# Patient Record
Sex: Male | Born: 1940 | Race: White | Hispanic: No | Marital: Married | Smoking: Current every day smoker
Health system: Southern US, Community
[De-identification: ages and names within clinical notes are randomized; demographics above are authoritative.]

## PROBLEM LIST (undated history)

## (undated) HISTORY — PX: APPENDECTOMY: SHX54

## (undated) HISTORY — PX: JOINT REPLACEMENT: SHX530

---

## 2009-10-22 ENCOUNTER — Encounter: Admission: RE | Admit: 2009-10-22 | Discharge: 2009-10-22 | Payer: Self-pay | Admitting: Endocrinology

## 2011-03-17 IMAGING — US US SOFT TISSUE HEAD/NECK
1 series · 13 of 25 positions shown · non-contrast
Comparison: None.

CLINICAL DATA: Lump left sided neck with hoarseness.

THYROID ULTRASOUND
TECHNIQUE: Ultrasound examination of the thyroid gland and adjacent
soft tissues was performed.

[Series 1: us soft tissue head/neck · 0.09mm/px · 13 of 44 slices shown]
[im 1/44]
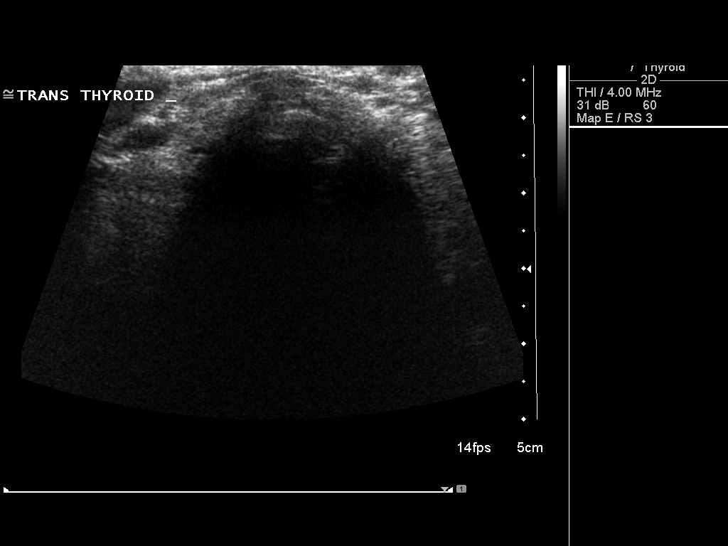
[im 4/44]
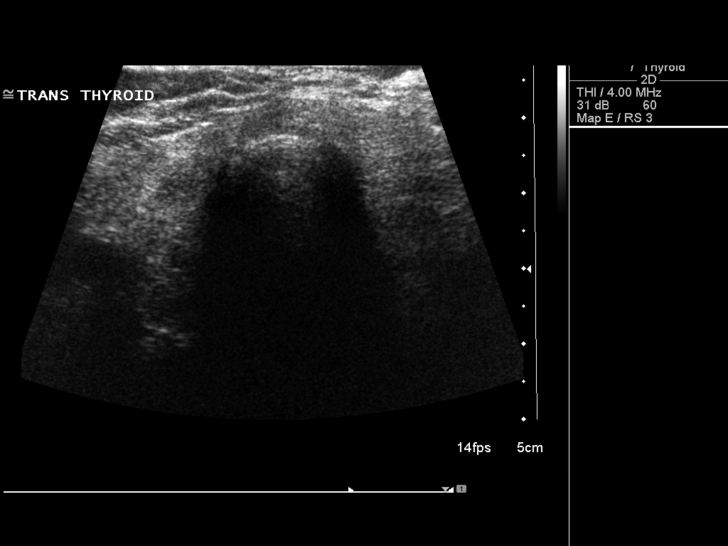
[im 8/44]
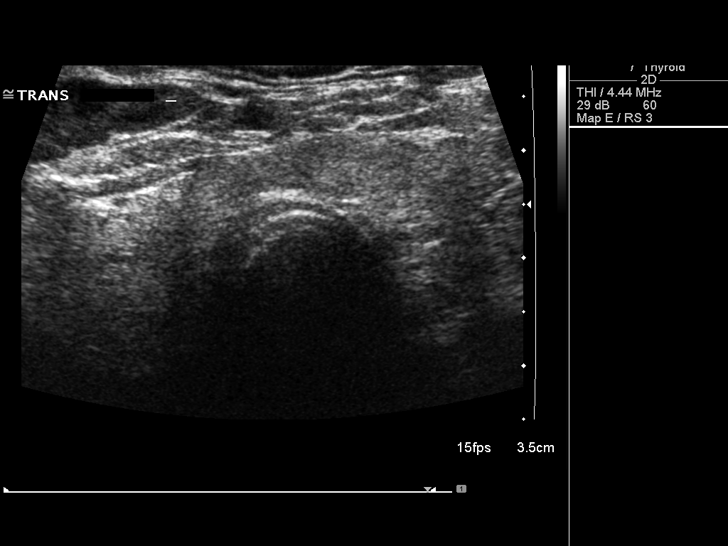
[im 11/44]
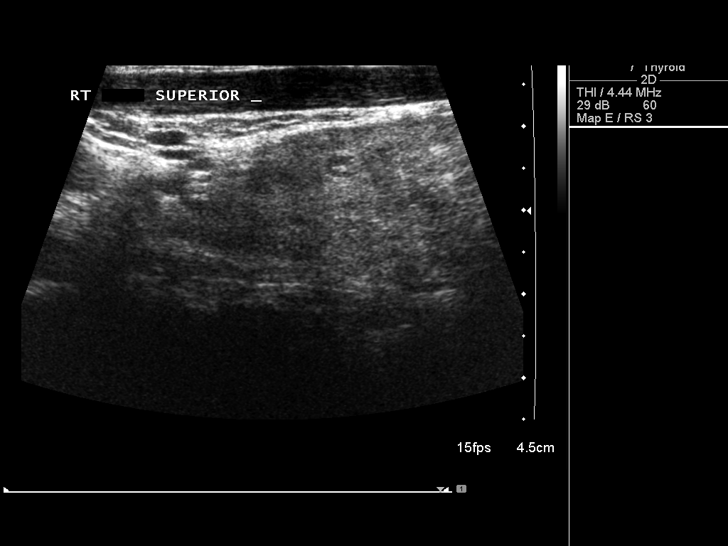
[im 15/44]
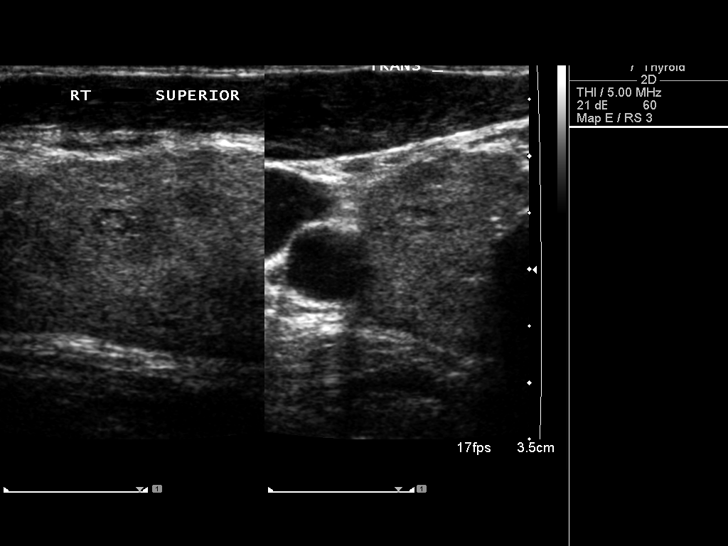
[im 18/44]
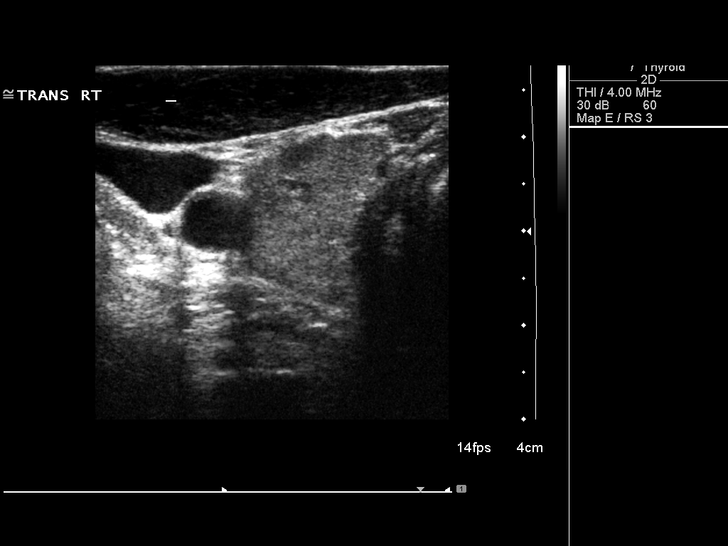
[im 22/44]
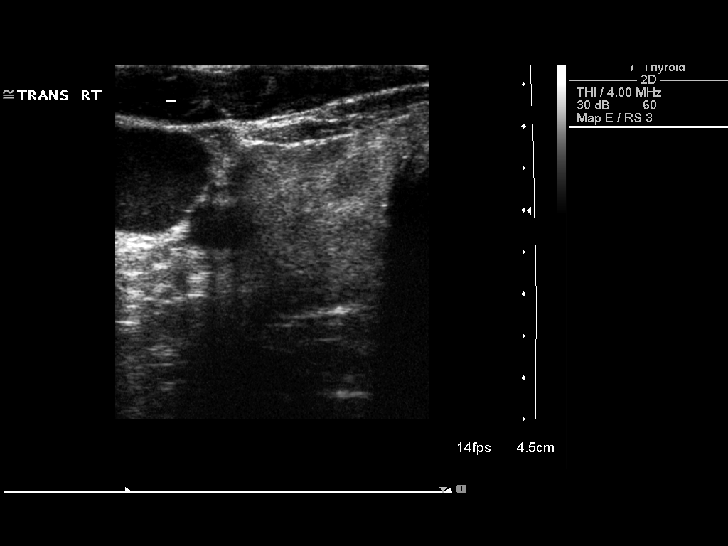
[im 26/44]
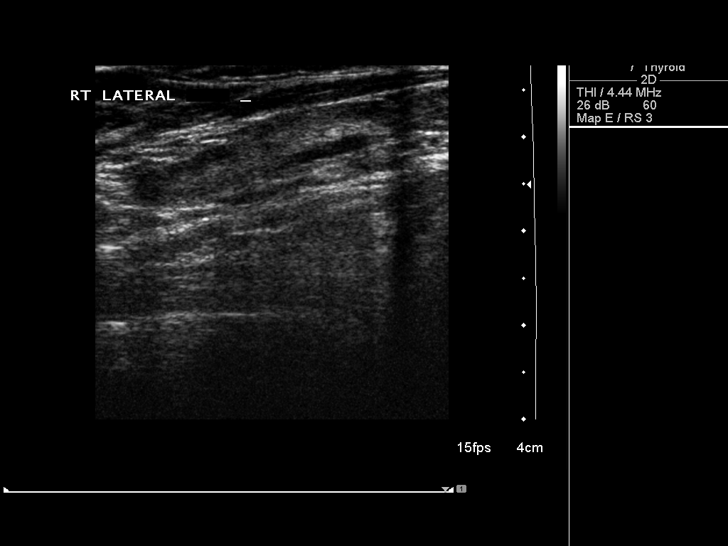
[im 29/44]
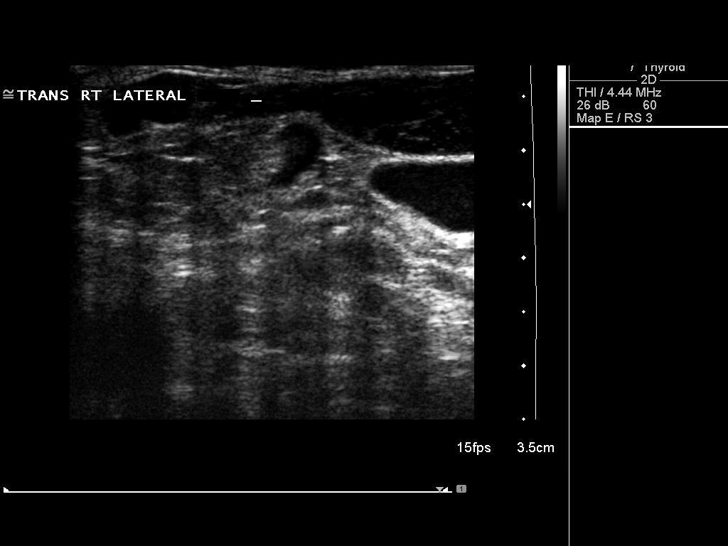
[im 33/44]
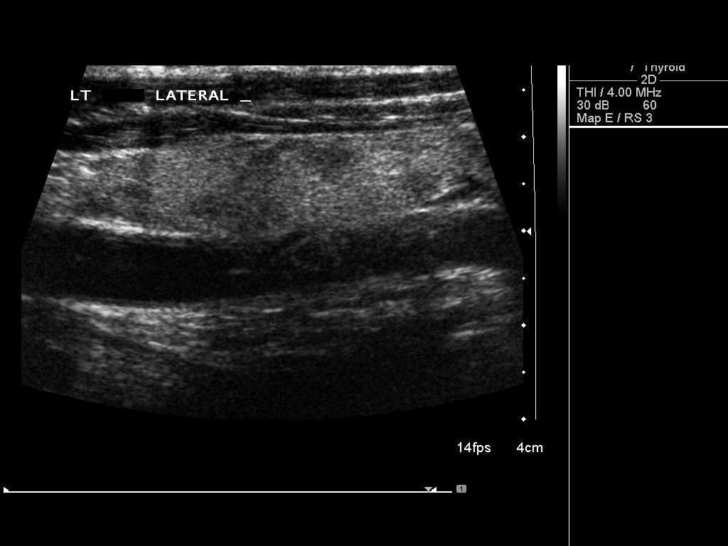
[im 36/44]
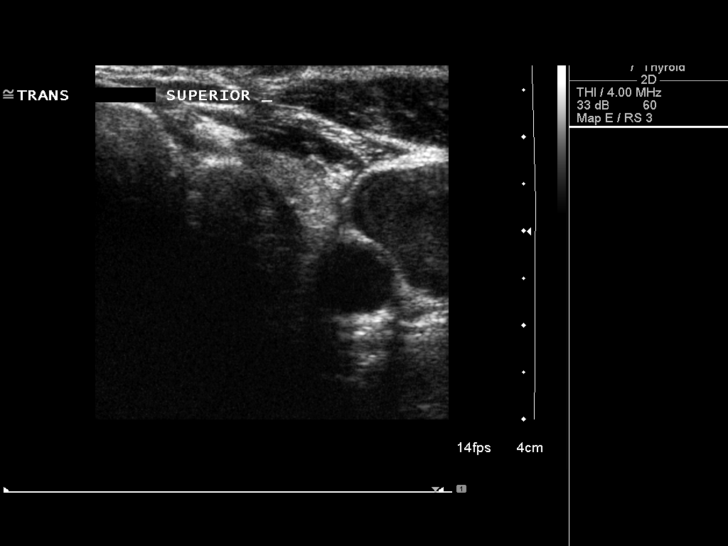
[im 40/44]
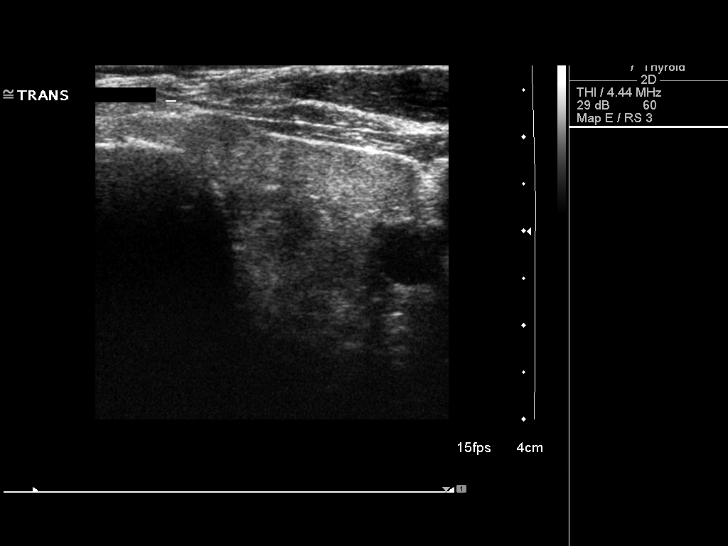
[im 44/44]
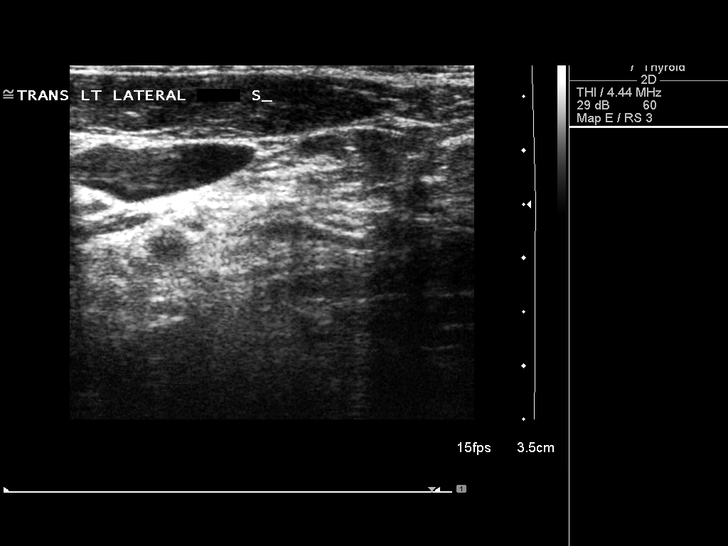

[13 of 25 positions shown; findings below may reference images not displayed]

FINDINGS: Right thyroid lobe:  6.1 cm long X 2.1 cm AP X 2.1 cm wide.
Left thyroid lobe:  5.0 cm long X 2.0 cm AP X 2.2 cm wide.
Isthmus:  6 mm AP thickness.

Focal nodules:  Thyroid echotexture diffusely heterogeneous.  At
the upper pole right lobe is 6 mm long X 4 mm AP X 5 mm
heterogeneous hypoechoic solid nodule.  No other focal thyroid
lesions seen.

Lymphadenopathy:  In region of clinical concern at the left
cervical region is no sonographic adenopathy or cystic/solid mass.
At the right mid neck is lymph node measuring 3.0 cm long X 0.6 cm
AP X 0.7 cm wide with retention of normal fatty hilum centrally.
IMPRESSION: 1.  Slight generalized thyromegaly with heterogeneous echotexture
consistent with slight goiter.
2.  Solitary 6 mm solid nodule upper pole right favors benign
etiology such as adenoma.
3.  No left cervical adenopathy in region of palpable concern.  Mid
right cervical lymph node upper limits of normal - probably benign.
If clinical findings persist or progress consider neck CT with
intravenous contrast.

## 2015-12-29 ENCOUNTER — Encounter: Payer: Self-pay | Admitting: Emergency Medicine

## 2015-12-29 ENCOUNTER — Emergency Department (INDEPENDENT_AMBULATORY_CARE_PROVIDER_SITE_OTHER)
Admission: EM | Admit: 2015-12-29 | Discharge: 2015-12-29 | Disposition: A | Discharge status: Home / Self Care | Payer: Medicare Other | Source: Home / Self Care | Attending: Emergency Medicine | Admitting: Emergency Medicine

## 2015-12-29 DIAGNOSIS — L02511 Cutaneous abscess of right hand: Secondary | ICD-10-CM

## 2015-12-29 DIAGNOSIS — L03011 Cellulitis of right finger: Secondary | ICD-10-CM

## 2015-12-29 MED ORDER — AMOXICILLIN-POT CLAVULANATE 875-125 MG PO TABS: 1.0000 | ORAL_TABLET | Freq: Two times a day (BID) | ORAL | 0 refills | Status: AC

## 2015-12-29 NOTE — ED Triage Notes (Signed)
Rt index finger, blisters, draining, painful x 2 weeks

## 2015-12-29 NOTE — ED Provider Notes (Signed)
[facility]    CSN: [id number] Arrival date & time: 12/29/15  1325     History   Chief Complaint Chief Complaint  Patient presents with  . Cellulitis    HPI Christian Dougherty is a 75 y.o. male.   The history is provided by the patient.  Hand Pain  This is a new (Right index finger pain) problem. Episode onset: 2 weeks ago. The problem occurs constantly. Progression since onset: Gradually worsening. Pertinent negatives include no chest pain, no abdominal pain, no headaches and no shortness of breath. The symptoms are aggravated by bending. The symptoms are relieved by rest.   Right index finger pain, burning type pain moderate intensity for 2 weeks. Associated with some blisters. He feels he may have gotten infection from being [place] the beach going fishing, and may have had some cracks in the skin where any infection could've gotten in. He does not recall any specific injury.  On his own, yesterday, he tried to opening one of the blisters in the back of right second finger, and it drained some clearish fluid with a little blood, and the bleeding stopped.  Associated symptoms: Denies fever or chills or nausea or vomiting or numbness or weakness or lightheadedness or syncope.  History reviewed. No pertinent past medical history. Past medical history: Hypertension, GERD, arthritis There are no active problems to display for this patient.  He states he just finished a course of doxycycline and prednisone for acute bronchitis and neck cleared up of bronchitis. Denies any cardiorespiratory symptoms. Denies shortness of breath or cough. Past Surgical History:  Procedure Laterality Date  . APPENDECTOMY    . JOINT REPLACEMENT         Home Medications    Prior to Admission medications   Medication Sig Start Date End Date Taking? Authorizing Provider  albuterol (PROVENTIL HFA;VENTOLIN HFA) 108 (90 Base) MCG/ACT inhaler Inhale into the lungs every 6 (six) hours as needed for  wheezing or shortness of breath.   Yes Historical Provider, MD  ammonium lactate (LAC-HYDRIN) 12 % lotion Apply 1 application topically as needed for dry skin.   Yes Historical Provider, MD  amoxicillin (AMOXIL) 500 MG capsule Take 500 mg by mouth 3 (three) times daily.   Yes Historical Provider, MD  budesonide-formoterol (SYMBICORT) 160-4.5 MCG/ACT inhaler Inhale 2 puffs into the lungs 2 (two) times daily.   Yes Historical Provider, MD  cholecalciferol (VITAMIN D) 1000 units tablet Take 1,000 Units by mouth daily.   Yes Historical Provider, MD  DULoxetine (CYMBALTA) 60 MG capsule Take 60 mg by mouth daily.   Yes Historical Provider, MD  furosemide (LASIX) 40 MG tablet Take 40 mg by mouth.   Yes Historical Provider, MD  glucosamine-chondroitin 500-400 MG tablet Take 1 tablet by mouth 3 (three) times daily.   Yes Historical Provider, MD  HYDROcodone-acetaminophen (NORCO/VICODIN) 5-325 MG tablet Take 1 tablet by mouth every 6 (six) hours as needed for moderate pain.   Yes Historical Provider, MD  lansoprazole (PREVACID) 30 MG capsule Take 30 mg by mouth daily at 12 noon.   Yes Historical Provider, MD  linaclotide (LINZESS) 290 MCG CAPS capsule Take 290 mcg by mouth daily before breakfast.   Yes Historical Provider, MD  Loratadine 10 MG CAPS Take by mouth.   Yes Historical Provider, MD  losartan (COZAAR) 25 MG tablet Take 25 mg by mouth daily.   Yes Historical Provider, MD  Melatonin 5 MG CAPS Take by mouth.   Yes Historical Provider, MD  simvastatin (ZOCOR) 20 MG tablet Take 20 mg by mouth daily.   Yes Historical Provider, MD  tiotropium (SPIRIVA) 18 MCG inhalation capsule Place 18 mcg into inhaler and inhale daily.   Yes Historical Provider, MD  traMADol (ULTRAM) 50 MG tablet Take by mouth every 6 (six) hours as needed.   Yes Historical Provider, MD  amoxicillin-clavulanate (AUGMENTIN) 875-125 MG tablet Take 1 tablet by mouth 2 (two) times daily. For 10 days. Take with food. 12/29/15   Lajean Manes,  MD    Family History Family History  Problem Relation Age of Onset  . COPD Mother   Father: Heart disease Sr. with diabetes and heart failure  Social History Social History  Substance Use Topics  . Smoking status: Current Every Day Smoker    Packs/day: 1.00    Years: 55.00    Types: Cigarettes  . Smokeless tobacco: Never Used  . Alcohol use No     Allergies   Asa [aspirin]; Darvon [propoxyphene]; Lyrica [pregabalin]; Nsaids; Sulfa antibiotics; and Vibramycin [doxycycline]   Review of Systems Review of Systems  Respiratory: Negative for shortness of breath.   Cardiovascular: Negative for chest pain.  Gastrointestinal: Negative for abdominal pain.  Neurological: Negative for headaches.  All other systems reviewed and are negative.    Physical Exam Triage Vital Signs ED Triage Vitals  Enc Vitals Group     BP 12/29/15 1352 102/68     Pulse Rate 12/29/15 1352 92     Resp --      Temp 12/29/15 1352 98.1 F (36.7 C)     Temp Source 12/29/15 1352 Oral     SpO2 12/29/15 1352 96 %     Weight 12/29/15 1352 180 lb (81.6 kg)     Height 12/29/15 1352 5\' 8"  (1.727 m)     Head Circumference --      Peak Flow --      Pain Score 12/29/15 1410 6     Pain Loc --      Pain Edu? --      Excl. in GC? --    No data found.   Updated Vital Signs BP 102/68 (BP Location: Left Arm)   Pulse 92   Temp 98.1 F (36.7 C) (Oral)   Ht 5\' 8"  (1.727 m)   Wt 180 lb (81.6 kg)   SpO2 96%   BMI 27.37 kg/m   Visual Acuity Right Eye Distance:   Left Eye Distance:   Bilateral Distance:    Right Eye Near:   Left Eye Near:    Bilateral Near:     Physical Exam  Constitutional: He is oriented to person, place, and time. He appears well-developed and well-nourished. No distress.  HENT:  Head: Normocephalic and atraumatic.  Eyes: Pupils are equal, round, and reactive to light. No scleral icterus.  Neck: Normal range of motion. Neck supple.  Cardiovascular: Normal rate and regular  rhythm.   Pulmonary/Chest: Effort normal.  Abdominal: He exhibits no distension.  Musculoskeletal:       Right wrist: Normal.       Right hand: He exhibits normal range of motion and normal capillary refill. Normal sensation noted. Normal strength noted.       Hands: Neurological: He is alert and oriented to person, place, and time.  Skin: Skin is warm and dry.  Psychiatric: He has a normal mood and affect. His behavior is normal.     UC Treatments / Results  Labs (all labs ordered are listed, but  only abnormal results are displayed) Labs Reviewed  WOUND CULTURE    EKG  EKG Interpretation None       Radiology No results found.  Procedures Procedures (including critical care time) After risks, benefits, alternatives discussed, patient preferred that I perform minimal pinpoint I&D. Sterile prep. #11 blade,nicked the lesion dorsum right index finger, tiny amount of light yellow pus exuded with blood. Swab, sent for culture. Wound dressed. After care discussed. An After Visit Summary was printed and given to the patient. We reviewed AVS at length, see AVS for multiple details Questions invited and answered.  Medications Ordered in UC Medications - No data to display   Initial Impression / Assessment and Plan / UC Course  I have reviewed the triage vital signs and the nursing notes.  Pertinent labs & imaging results that were available during my care of the patient were reviewed by me and considered in my medical decision making (see chart for details).  Clinical Course     Final Clinical Impressions(s) / UC Diagnoses   Final diagnoses:  Cellulitis of right index finger  Abscess of right index finger    New Prescriptions New Prescriptions   AMOXICILLIN-CLAVULANATE (AUGMENTIN) 875-125 MG TABLET    Take 1 tablet by mouth 2 (two) times daily. For 10 days. Take with food.     Lajean Manesavid Massey, MD 01/03/16 [phone]

## 2015-12-29 NOTE — Discharge Instructions (Signed)
We're sending off a culture from the drainage from right index finger. May take 2-3 + days for results to come back. Take the antibiotic as prescribed. OTC Tylenol for mild-to-moderate pain. May use the tramadol you have at home if needed for severe pain.  Warm soaks for 15 minutes twice a day, then reapply bandage

## 2016-01-01 LAB — WOUND CULTURE
Gram Stain: NONE SEEN
Gram Stain: NONE SEEN
Gram Stain: NONE SEEN
Organism ID, Bacteria: NO GROWTH

## 2016-01-02 ENCOUNTER — Telehealth: Payer: Self-pay | Admitting: Emergency Medicine

## 2016-01-02 NOTE — Telephone Encounter (Signed)
Spoke with patient via telephone and gave culture results. No growth

## 2021-10-07 ENCOUNTER — Other Ambulatory Visit: Payer: Self-pay | Admitting: *Deleted

## 2021-10-07 NOTE — Patient Outreach (Signed)
  Care Coordination [facility] Note Transition Care Management Follow-up Telephone Call Date of discharge and from where: [phone] [facility] How have you been since you were released from the hospital? Feel like you been hit by a truck Any questions or concerns? No  Items Reviewed: Did the pt receive and understand the discharge instructions provided? Yes  Medications obtained and verified? Yes  Other? No  Any new allergies since your discharge? No  Dietary orders reviewed? Yes Do you have support at home? Yes   Home Care and Equipment/Supplies: Were home health services ordered? yes If so, what is the name of the agency? [facility]  Has the agency set up a time to come to the patient's home? yes Were any new equipment or medical supplies ordered?  No What is the name of the medical supply agency? N  Were you able to get the supplies/equipment? not applicable Do you have any questions related to the use of the equipment or supplies? No  Functional Questionnaire: (I = Independent and D = Dependent) ADLs: D  Bathing/Dressing- D   Meal Prep- D  Eating- I  Maintaining continence- I  Transferring/Ambulation- D  Managing Meds- I  Follow up appointments reviewed:  PCP Hospital f/u appt confirmed? No   Specialist Hospital f/u appt confirmed? Yes  Scheduled to see  on October 18, 2021 4:50 Are transportation arrangements needed? No  If their condition worsens, is the pt aware to call PCP or go to the Emergency Dept.? Yes Was the patient provided with contact information for the PCP's office or ED? Yes Was to pt encouraged to call back with questions or concerns? Yes  SDOH assessments and interventions completed:   Yes  Care Coordination Interventions Activated:  No Care Coordination Interventions:   n/a  Encounter Outcome:  Pt. Visit Completed
